# Patient Record
Sex: Female | Born: 1960 | Hispanic: No | Marital: Single | Smoking: Never smoker
Health system: Southern US, Community
[De-identification: ages and names within clinical notes are randomized; demographics above are authoritative.]

---

## 1998-05-14 ENCOUNTER — Other Ambulatory Visit: Admission: RE | Admit: 1998-05-14 | Discharge: 1998-05-14 | Payer: Self-pay | Admitting: Gynecology

## 2015-05-12 ENCOUNTER — Ambulatory Visit (INDEPENDENT_AMBULATORY_CARE_PROVIDER_SITE_OTHER): Payer: 59

## 2015-05-12 ENCOUNTER — Ambulatory Visit (INDEPENDENT_AMBULATORY_CARE_PROVIDER_SITE_OTHER): Payer: 59 | Admitting: Physician Assistant

## 2015-05-12 VITALS — BP 132/84 | HR 89 | Temp 98.4°F | Resp 16 | Ht 64.0 in | Wt 132.4 lb

## 2015-05-12 DIAGNOSIS — M10071 Idiopathic gout, right ankle and foot: Secondary | ICD-10-CM

## 2015-05-12 DIAGNOSIS — M25474 Effusion, right foot: Secondary | ICD-10-CM

## 2015-05-12 LAB — POCT CBC
GRANULOCYTE PERCENT: 77.1 % (ref 37–80)
HCT, POC: 38.4 % (ref 37.7–47.9)
Hemoglobin: 13.9 g/dL (ref 12.2–16.2)
Lymph, poc: 1.2 (ref 0.6–3.4)
MCH: 29.8 pg (ref 27–31.2)
MCHC: 36 g/dL — AB (ref 31.8–35.4)
MCV: 82.7 fL (ref 80–97)
MID (CBC): 0.5 (ref 0–0.9)
MPV: 7.4 fL (ref 0–99.8)
PLATELET COUNT, POC: 203 10*3/uL (ref 142–424)
POC GRANULOCYTE: 5.6 (ref 2–6.9)
POC LYMPH PERCENT: 16.3 %L (ref 10–50)
POC MID %: 6.6 %M (ref 0–12)
RBC: 4.65 M/uL (ref 4.04–5.48)
RDW, POC: 13.3 %
WBC: 7.3 10*3/uL (ref 4.6–10.2)

## 2015-05-12 LAB — POCT SEDIMENTATION RATE: POCT SED RATE: 36 mm/hr — AB (ref 0–22)

## 2015-05-12 LAB — URIC ACID: URIC ACID, SERUM: 3.5 mg/dL (ref 2.4–7.0)

## 2015-05-12 LAB — GLUCOSE, POCT (MANUAL RESULT ENTRY): POC Glucose: 99 mg/dl (ref 70–99)

## 2015-05-12 MED ORDER — PREDNISONE 20 MG PO TABS: ORAL_TABLET | ORAL | Status: DC

## 2015-05-12 NOTE — Patient Instructions (Addendum)
Take prednisone 3 tabs for 3 days, 2 tabs for 3 days, then 1 tab for 3 days. Look over information on gout below and diet to follow I will call you with your lab results Return in 1 week if symptoms do not improve or at any time if symptoms worsen.     IF you received an x-ray today, you will receive an invoice from [facility]. Please contact [facility] at [phone] with questions or concerns regarding your invoice.   IF you received labwork today, you will receive an invoice from [employer]. Please contact [facility] at [phone] with questions or concerns regarding your invoice.   Our billing staff will not be able to assist you with questions regarding bills from these companies.  You will be contacted with the lab results as soon as they are available. The fastest way to get your results is to activate your My Chart account. Instructions are located on the last page of this paperwork. If you have not heard from [place] regarding the results in 2 weeks, please contact this office.     goutGout Gout is an inflammatory arthritis caused by a buildup of uric acid crystals in the joints. Uric acid is a chemical that is normally present in the blood. When the level of uric acid in the blood is too high it can form crystals that deposit in your joints and tissues. This causes joint redness, soreness, and swelling (inflammation). Repeat attacks are common. Over time, uric acid crystals can form into masses (tophi) near a joint, destroying bone and causing disfigurement. Gout is treatable and often preventable. CAUSES  The disease begins with elevated levels of uric acid in the blood. Uric acid is produced by your body when it breaks down a naturally found substance called purines. Certain foods you eat, such as meats and fish, contain high amounts of purines. Causes of an elevated uric acid level include:  Being passed down from parent to child  (heredity).  Diseases that cause increased uric acid production (such as obesity, psoriasis, and certain cancers).  Excessive alcohol use.  Diet, especially diets rich in meat and seafood.  Medicines, including certain cancer-fighting medicines (chemotherapy), water pills (diuretics), and aspirin.  Chronic kidney disease. The kidneys are no longer able to remove uric acid well.  Problems with metabolism. Conditions strongly associated with gout include:  Obesity.  High blood pressure.  High cholesterol.  Diabetes. Not everyone with elevated uric acid levels gets gout. It is not understood why some people get gout and others do not. Surgery, joint injury, and eating too much of certain foods are some of the factors that can lead to gout attacks. SYMPTOMS   An attack of gout comes on quickly. It causes intense pain with redness, swelling, and warmth in a joint.  Fever can occur.  Often, only one joint is involved. Certain joints are more commonly involved:  Base of the big toe.  Knee.  Ankle.  Wrist.  Finger. Without treatment, an attack usually goes away in a few days to weeks. Between attacks, you usually will not have symptoms, which is different from many other forms of arthritis. DIAGNOSIS  Your caregiver will suspect gout based on your symptoms and exam. In some cases, tests may be recommended. The tests may include:  Blood tests.  Urine tests.  X-rays.  Joint fluid exam. This exam requires a needle to remove fluid from the joint (arthrocentesis). Using a microscope, gout is confirmed when uric  acid crystals are seen in the joint fluid. TREATMENT  There are two phases to gout treatment: treating the sudden onset (acute) attack and preventing attacks (prophylaxis).  Treatment of an Acute Attack.  Medicines are used. These include anti-inflammatory medicines or steroid medicines.  An injection of steroid medicine into the affected joint is sometimes  necessary.  The painful joint is rested. Movement can worsen the arthritis.  You may use warm or cold treatments on painful joints, depending which works best for you.  Treatment to Prevent Attacks.  If you suffer from frequent gout attacks, your caregiver may advise preventive medicine. These medicines are started after the acute attack subsides. These medicines either help your kidneys eliminate uric acid from your body or decrease your uric acid production. You may need to stay on these medicines for a very long time.  The early phase of treatment with preventive medicine can be associated with an increase in acute gout attacks. For this reason, during the first few months of treatment, your caregiver may also advise you to take medicines usually used for acute gout treatment. Be sure you understand your caregiver's directions. Your caregiver may make several adjustments to your medicine dose before these medicines are effective.  Discuss dietary treatment with your caregiver or dietitian. Alcohol and drinks high in sugar and fructose and foods such as meat, poultry, and seafood can increase uric acid levels. Your caregiver or dietitian can advise you on drinks and foods that should be limited. HOME CARE INSTRUCTIONS   Do not take aspirin to relieve pain. This raises uric acid levels.  Only take over-the-counter or prescription medicines for pain, discomfort, or fever as directed by your caregiver.  Rest the joint as much as possible. When in bed, keep sheets and blankets off painful areas.  Keep the affected joint raised (elevated).  Apply warm or cold treatments to painful joints. Use of warm or cold treatments depends on which works best for you.  Use crutches if the painful joint is in your leg.  Drink enough fluids to keep your urine clear or pale yellow. This helps your body get rid of uric acid. Limit alcohol, sugary drinks, and fructose drinks.  Follow your dietary  instructions. Pay careful attention to the amount of protein you eat. Your daily diet should emphasize fruits, vegetables, whole grains, and fat-free or low-fat milk products. Discuss the use of coffee, vitamin C, and cherries with your caregiver or dietitian. These may be helpful in lowering uric acid levels.  Maintain a healthy body weight. SEEK MEDICAL CARE IF:   You develop diarrhea, vomiting, or any side effects from medicines.  You do not feel better in 24 hours, or you are getting worse. SEEK IMMEDIATE MEDICAL CARE IF:   Your joint becomes suddenly more tender, and you have chills or a fever. MAKE SURE YOU:   Understand these instructions.  Will watch your condition.  Will get help right away if you are not doing well or get worse.   This information is not intended to replace advice given to you by your health care provider. Make sure you discuss any questions you have with your health care provider.   Document Released: 12/25/1999 Document Revised: 01/17/2014 Document Reviewed: 08/10/2011 Elsevier Interactive Patient Education [employer]2016 Elsevier Inc.

## 2015-05-12 NOTE — Progress Notes (Signed)
[facility] [address] [place] [address] [phone]  Date:  05/12/2015   Name:  Kristin Robbins   DOB:  Apr 29, 1960   MRN:  [id number]  PCP:  No primary care provider on file.    Chief Complaint: Foot Swelling   History of Present Illness:  This is a 55 y.o. female who is presenting with right foot pain and swelling x 2 days. States she woke yesterday morning with the pain. As day went on pain worsened. It got to point last night where she could not even touch her foot without causing incredible pain. Pain is a little better this morning. She is feeling ok, no fever or malaise. NKI. She was moving furniture this weekend but states no injury. She has tried some extra strength tylenol without any relief. No hx of gout but her brother has gout. She denies numbness.  Pt does not drink alcohol. Does not eat much red meat. Does not eat much shellfish. States she has oatmeal for breakfast. Salad for lunch. Chicken and vegetables for dinner.  Review of Systems:  Review of Systems See HPI  There are no active problems to display for this patient.   Prior to Admission medications   Not on File    Allergies  Allergen Reactions  . Latex Hives    History reviewed. No pertinent past surgical history.  Social History  Substance Use Topics  . Smoking status: Never Smoker   . Smokeless tobacco: None  . Alcohol Use: No    Family History  Problem Relation Age of Onset  . Hypertension Mother   . Diabetes Father   . Heart disease Father   . Hypertension Father     Medication list has been reviewed and updated.  Physical Examination:  Physical Exam  Constitutional: She is oriented to person, place, and time. She appears well-developed and well-nourished. No distress.  HENT:  Head: Normocephalic and atraumatic.  Right Ear: Hearing normal.  Left Ear: Hearing normal.  Nose: Nose normal.  Eyes: Conjunctivae and lids are normal. Right eye exhibits no  discharge. Left eye exhibits no discharge. No scleral icterus.  Cardiovascular: Normal rate, regular rhythm and normal pulses.   Pulmonary/Chest: Effort normal. No respiratory distress.  Musculoskeletal: Normal range of motion.       Right ankle: Normal. She exhibits normal range of motion. No tenderness. Achilles tendon normal.       Left ankle: Normal.       Right foot: There is tenderness, bony tenderness and swelling (swelling, erythema and warmth over dorsal and later foot). There is normal range of motion and normal capillary refill.       Left foot: Normal.  Pedal pulses intact and symmetric  Neurological: She is alert and oriented to person, place, and time. She has normal strength. No sensory deficit. Gait (antalgic) abnormal.  Skin: Skin is warm, dry and intact.  Psychiatric: She has a normal mood and affect. Her speech is normal and behavior is normal. Thought content normal.   BP 132/84 mmHg  Pulse 89  Temp(Src) 98.4 F (36.9 C) (Oral)  Resp 16  Ht  (1.626 m)  Wt 132 lb 6.4 oz (60.056 kg)  BMI 22.72 kg/m2  SpO2 98% Dg Foot Complete Right  05/12/2015  CLINICAL DATA:  Right foot pain and swelling. EXAM: RIGHT FOOT COMPLETE - 3+ VIEW COMPARISON:  No recent prior. FINDINGS: No acute bony or joint abnormality identified. No evidence fracture dislocation. IMPRESSION:  No acute abnormality. Electronically Signed   By: Maisie Fushomas  Register   On: 05/12/2015 09:14   Results for orders placed or performed in visit on 05/12/15  POCT CBC  Result Value Ref Range   WBC 7.3 4.6 - 10.2 K/uL   Lymph, poc 1.2 0.6 - 3.4   POC LYMPH PERCENT 16.3 10 - 50 %L   MID (cbc) 0.5 0 - 0.9   POC MID % 6.6 0 - 12 %M   POC Granulocyte 5.6 2 - 6.9   Granulocyte percent 77.1 37 - 80 %G   RBC 4.65 4.04 - 5.48 M/uL   Hemoglobin 13.9 12.2 - 16.2 g/dL   HCT, POC 29.538.4 62.137.7 - 47.9 %   MCV 82.7 80 - 97 fL   MCH, POC 29.8 27 - 31.2 pg   MCHC 36.0 (A) 31.8 - 35.4 g/dL   RDW, POC 30.813.3 %   Platelet Count, POC  203 142 - 424 K/uL   MPV 7.4 0 - 99.8 fL  POCT SEDIMENTATION RATE  Result Value Ref Range   POCT SED RATE 36 (A) 0 - 22 mm/hr  POCT glucose (manual entry)  Result Value Ref Range   POC Glucose 99 70 - 99 mg/dl    Assessment and Plan:  1. Acute idiopathic gout of right foot 2. Swelling of foot joint, right Radiograph negative. CBC wnl. Sed rate mildly elevated to 36. Suspected gout. Treat with prednisone taper. Uric acid pending. Could not identify a cause for gout, possibly just genetic. She eats very healthy diet, low in red meat and seafood. No alcohol intake. No medications regularly. Return if symptoms not improving in 1 week or at any time if symptoms worsen. - predniSONE (DELTASONE) 20 MG tablet; Take 3 PO QAM x3days, 2 PO QAM x3days, 1 PO QAM x3days  Dispense: 18 tablet; Refill: 0 - DG Foot Complete Right; Future - POCT CBC - POCT SEDIMENTATION RATE - Uric Acid - POCT glucose (manual entry)   Roswell MinersNicole V. Dyke BrackettBush, PA-C, MHS [facility]  05/12/2015

## 2015-06-30 ENCOUNTER — Ambulatory Visit (INDEPENDENT_AMBULATORY_CARE_PROVIDER_SITE_OTHER): Payer: 59 | Admitting: Family Medicine

## 2015-06-30 VITALS — BP 126/88 | HR 81 | Temp 98.6°F | Resp 16 | Ht 65.0 in | Wt 132.4 lb

## 2015-06-30 DIAGNOSIS — M2550 Pain in unspecified joint: Secondary | ICD-10-CM | POA: Diagnosis not present

## 2015-06-30 DIAGNOSIS — M1990 Unspecified osteoarthritis, unspecified site: Secondary | ICD-10-CM | POA: Diagnosis not present

## 2015-06-30 DIAGNOSIS — M0579 Rheumatoid arthritis with rheumatoid factor of multiple sites without organ or systems involvement: Secondary | ICD-10-CM

## 2015-06-30 LAB — COMPREHENSIVE METABOLIC PANEL
ALK PHOS: 74 U/L (ref 33–130)
ALT: 27 U/L (ref 6–29)
AST: 21 U/L (ref 10–35)
Albumin: 4.3 g/dL (ref 3.6–5.1)
BUN: 11 mg/dL (ref 7–25)
CO2: 26 mmol/L (ref 20–31)
Calcium: 8.8 mg/dL (ref 8.6–10.4)
Chloride: 102 mmol/L (ref 98–110)
Creat: 0.7 mg/dL (ref 0.50–1.05)
GLUCOSE: 110 mg/dL — AB (ref 65–99)
POTASSIUM: 3.8 mmol/L (ref 3.5–5.3)
Sodium: 139 mmol/L (ref 135–146)
Total Bilirubin: 0.6 mg/dL (ref 0.2–1.2)
Total Protein: 6.8 g/dL (ref 6.1–8.1)

## 2015-06-30 LAB — C-REACTIVE PROTEIN: CRP: 1.8 mg/dL — AB (ref <0.60)

## 2015-06-30 LAB — POCT CBC
GRANULOCYTE PERCENT: 77.9 % (ref 37–80)
HCT, POC: 42.2 % (ref 37.7–47.9)
Hemoglobin: 14.9 g/dL (ref 12.2–16.2)
Lymph, poc: 0.9 (ref 0.6–3.4)
MCH, POC: 29.1 pg (ref 27–31.2)
MCHC: 35.2 g/dL (ref 31.8–35.4)
MCV: 82.6 fL (ref 80–97)
MID (CBC): 0.4 (ref 0–0.9)
MPV: 7.5 fL (ref 0–99.8)
PLATELET COUNT, POC: 200 10*3/uL (ref 142–424)
POC Granulocyte: 4.8 (ref 2–6.9)
POC LYMPH %: 14.9 % (ref 10–50)
POC MID %: 7.2 %M (ref 0–12)
RBC: 5.11 M/uL (ref 4.04–5.48)
RDW, POC: 13.6 %
WBC: 6.1 10*3/uL (ref 4.6–10.2)

## 2015-06-30 LAB — VITAMIN B12: Vitamin B-12: 384 pg/mL (ref 200–1100)

## 2015-06-30 LAB — TSH: TSH: 2.37 mIU/L

## 2015-06-30 LAB — RHEUMATOID FACTOR: RHEUMATOID FACTOR: 476 [IU]/mL — AB (ref <=14)

## 2015-06-30 LAB — POCT SEDIMENTATION RATE: POCT SED RATE: 35 mm/h — AB (ref 0–22)

## 2015-06-30 LAB — CK: Total CK: 37 U/L (ref 7–177)

## 2015-06-30 MED ORDER — INDOMETHACIN 50 MG PO CAPS: 50.0000 mg | ORAL_CAPSULE | Freq: Two times a day (BID) | ORAL | Status: AC

## 2015-06-30 NOTE — Patient Instructions (Signed)
Do not use the indomethacin with any other otc pain medication other than tylenol/acetaminophen - so no aleve, ibuprofen, motrin, advil, etc.    IF you received an x-ray today, you will receive an invoice from [facility]. Please contact [facility] at [phone] with questions or concerns regarding your invoice.   IF you received labwork today, you will receive an invoice from [employer]. Please contact [facility] at [phone] with questions or concerns regarding your invoice.   Our billing staff will not be able to assist you with questions regarding bills from these companies.  You will be contacted with the lab results as soon as they are available. The fastest way to get your results is to activate your My Chart account. Instructions are located on the last page of this paperwork. If you have not heard from [place] regarding the results in 2 weeks, please contact this office.      Arthritis Arthritis is a term that is commonly used to refer to joint pain or joint disease. There are more than 100 types of arthritis. CAUSES The most common cause of this condition is wear and tear of a joint. Other causes include:  Gout.  Inflammation of a joint.  An infection of a joint.  Sprains and other injuries near the joint.  A drug reaction or allergic reaction. In some cases, the cause may not be known. SYMPTOMS The main symptom of this condition is pain in the joint with movement. Other symptoms include:  Redness, swelling, or stiffness at a joint.  Warmth coming from the joint.  Fever.  Overall feeling of illness. DIAGNOSIS This condition may be diagnosed with a physical exam and tests, including:  Blood tests.  Urine tests.  Imaging tests, such as MRI, X-rays, or a CT scan. Sometimes, fluid is removed from a joint for testing. TREATMENT Treatment for this condition may involve:  Treatment of the cause, if it is  known.  Rest.  Raising (elevating) the joint.  Applying cold or hot packs to the joint.  Medicines to improve symptoms and reduce inflammation.  Injections of a steroid such as cortisone into the joint to help reduce pain and inflammation. Depending on the cause of your arthritis, you may need to make lifestyle changes to reduce stress on your joint. These changes may include exercising more and losing weight. HOME CARE INSTRUCTIONS Medicines  Take over-the-counter and prescription medicines only as told by your health care provider.  Do not take aspirin to relieve pain if gout is suspected. Activities  Rest your joint if told by your health care provider. Rest is important when your disease is active and your joint feels painful, swollen, or stiff.  Avoid activities that make the pain worse. It is important to balance activity with rest.  Exercise your joint regularly with range-of-motion exercises as told by your health care provider. Try doing low-impact exercise, such as:  Swimming.  Water aerobics.  Biking.  Walking. Joint Care  If your joint is swollen, keep it elevated if told by your health care provider.  If your joint feels stiff in the morning, try taking a warm shower.  If directed, apply heat to the joint. If you have diabetes, do not apply heat without permission from your health care provider.  Put a towel between the joint and the hot pack or heating pad.  Leave the heat on the area for 20-30 minutes.  If directed, apply ice to the joint:  Put ice in  a plastic bag.  Place a towel between your skin and the bag.  Leave the ice on for 20 minutes, 2-3 times per day.  Keep all follow-up visits as told by your health care provider. This is important. SEEK MEDICAL CARE IF:  The pain gets worse.  You have a fever. SEEK IMMEDIATE MEDICAL CARE IF:  You develop severe joint pain, swelling, or redness.  Many joints become painful and swollen.  You  develop severe back pain.  You develop severe weakness in your leg.  You cannot control your bladder or bowels.   This information is not intended to replace advice given to you by your health care provider. Make sure you discuss any questions you have with your health care provider.   Document Released: 02/04/2004 Document Revised: 09/17/2014 Document Reviewed: 03/24/2014 Elsevier Interactive Patient Education [employer]2016 Elsevier Inc.

## 2015-06-30 NOTE — Progress Notes (Signed)
By signing my name below, I, Mesha Guinyard, attest that this documentation has been prepared under the direction and in the presence of Norberto Sorenson, MD.  Electronically Signed: Arvilla Market, Medical Scribe. 06/30/2015. 8:36 AM.  Subjective:    Patient ID: Kristin Robbins, female    DOB: May 27, 1960, 55 y.o.   MRN: [id number]  HPI Chief Complaint  Patient presents with  . Hand Pain    L wrist, R Hand, digits 2 & 3  . Edema    L wrist  . Shoulder Pain    L shoulder, tender to touch   First encounter 1 week ago HPI Comments: Kristin Robbins is a 55 y.o. female who presents to the [facility] complaining of swelling of the right 2nd DIP, 3rd PIP, and 1st IP finger onset 4 days ago. Pt states the dorsal aspect of her left wrist on the radial aspect had the same thing going on, and 2 days later it was as if it never happened. Pt reports associated symptoms of erythema and warmth in her fingers and wrist. Pt states she couldn't move her fingers and wrist due to swelling.  Pt reports left shouler pain onset yesterday- will last about 2 days as well. Pt reports weakness in her shoulder. Pt couldn't lift or move her shoulders with out pain producing tears. 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 Pt states a warm shower helped relieve her pain. Pt took Tylenol that gave little to no relief. Pt thought it might be triggered by food allergies so she stoped eating peanut butter, orange juice, and cut out saltty foods. Pt occasionally takes Vit. B complex supplements. Pt had a little cup of coffee, a boiled egg and some water this morning- her typical breakfast. Pt walks a little during lunch- she does desk work 8 hours a day. Pt has never had joint pain in the past. Joints were red and hot  Pt was in here May 2nd for swollen ankle and was put on Prednisone- she thought she had gout. Pt was on Prdenisone for 9 days.   FHx: Brother had gout. Mom has swollen joints but doesn't suffer from  it. Pt doesn't have a PCP.  Radial aspect of the dorsum on the left wrist  There are no active problems to display for this patient.  No past medical history on file. No past surgical history on file. Allergies  Allergen Reactions  . Latex Hives   Prior to Admission medications   Medication Sig Start Date End Date Taking? Authorizing Provider  predniSONE (DELTASONE) 20 MG tablet Take 3 PO QAM x3days, 2 PO QAM x3days, 1 PO QAM x3days Patient not taking: Reported on 06/30/2015 05/12/15   Dorna Leitz, PA-C   Social History   Social History  . Marital Status: Single    Spouse Name: N/A  . Number of Children: N/A  . Years of Education: N/A   Occupational History  . Not on file.   Social History Main Topics  . Smoking status: Never Smoker   . Smokeless tobacco: Not on file  . Alcohol Use: No  . Drug Use: No  . Sexual Activity: Not on file   Other Topics Concern  . Not on file   Social History Narrative   Depression screen [facility] 2/9 06/30/2015 05/12/2015  Decreased Interest 0 0  Down, Depressed, Hopeless 0 0  PHQ - 2 Score 0 0   Review of Systems  Constitutional: Positive  for fatigue. Negative for fever, chills and diaphoresis.  HENT: Negative for rhinorrhea and sneezing.   Respiratory: Negative for cough, chest tightness and shortness of breath.   Cardiovascular: Negative for chest pain and palpitations.  Gastrointestinal: Negative for abdominal pain, diarrhea and constipation.  Musculoskeletal: Positive for joint swelling and arthralgias. Negative for neck pain.  Skin: Negative for rash.  Neurological: Positive for weakness.       When shiulder hurts her arm has spasms  Psychiatric/Behavioral: Positive for sleep disturbance (bc of pain).     Objective:  BP 126/88 mmHg  Pulse 81  Temp(Src) 98.6 F (37 C) (Oral)  Resp 16  Ht 5\' 5"  (1.651 m)  Wt 132 lb 6.4 oz (60.056 kg)  BMI 22.03 kg/m2  SpO2 99%  Physical Exam  Constitutional: She appears well-developed and  well-nourished. No distress.  HENT:  Head: Normocephalic and atraumatic.  Eyes: Conjunctivae are normal.  Neck: Neck supple. No thyroid mass and no thyromegaly present.  Cardiovascular: Normal rate, regular rhythm and normal heart sounds.  Exam reveals no gallop and no friction rub.   No murmur heard. Pulmonary/Chest: Effort normal and breath sounds normal. No respiratory distress. She has no wheezes. She has no rales. She exhibits no tenderness.  Musculoskeletal:  Shoulder: mild tenderness over her left acromial bursa. No warmth, hyperesthesia, erythema, or edema  Lymphadenopathy:    She has no cervical adenopathy.  Neurological: She is alert.  Skin: Skin is warm and dry.  Psychiatric: She has a normal mood and affect. Her behavior is normal.  Nursing note and vitals reviewed.   Assessment & Plan:   1. Arthralgia   2. Joint inflammation   3. Rheumatoid arthritis involving multiple sites with positive rheumatoid factor (HCC)   Labs HIGHLY positive for RA - no family or personal h/o of any rheumatologic illness but pt had first sxs just 6 wks ago and since that time has now had 4 different joints (right foot, right IP joints, left wrist, now left shoulder) flair in isolated self-resolving episodes. She did NOT tolerate prednisone well so start on high dose NSAID - pt likely very good candidate for DMARD considering she is relatively young and very healthy so refer to rheum for further eval and treatment.   Orders Placed This Encounter  Procedures  . C-reactive protein  . ANA  . Comprehensive metabolic panel  . TSH  . Vitamin B12  . CK  . Rheumatoid factor  . Cyclic Citrul Peptide Antibody, IGG  . Anti-nuclear ab-titer (ANA titer)  . Ambulatory referral to Rheumatology    Referral Priority:  Routine    Referral Type:  Consultation    Referral Reason:  Specialty Services Required    Requested Specialty:  Rheumatology    Number of Visits Requested:  1  . POCT CBC  . POCT  SEDIMENTATION RATE    Meds ordered this encounter  Medications  . indomethacin (INDOCIN) 50 MG capsule    Sig: Take 1 capsule (50 mg total) by mouth 2 (two) times daily with a meal. As needed for joint inflammation and pain    Dispense:  60 capsule    Refill:  1    I personally performed the services described in this documentation, which was scribed in my presence. The recorded information has been reviewed and considered, and addended by me as needed.   Norberto SorensonEva Delford Wingert, M.D.  [facility]  [place] [address] [place], [place] [address] [phone] phone [phone]  [phone] fax  07/03/2015 10:16 AM  Results for orders placed or performed in visit on 06/30/15  C-reactive protein  Result Value Ref Range   CRP 1.8 (H) <0.60 mg/dL  ANA  Result Value Ref Range   Anit Nuclear Antibody(ANA) POS (A) NEGATIVE  Comprehensive metabolic panel  Result Value Ref Range   Sodium 139 135 - 146 mmol/L   Potassium 3.8 3.5 - 5.3 mmol/L   Chloride 102 98 - 110 mmol/L   CO2 26 20 - 31 mmol/L   Glucose, Bld 110 (H) 65 - 99 mg/dL   BUN 11 7 - 25 mg/dL   Creat 9.81 1.91 - 4.78 mg/dL   Total Bilirubin 0.6 0.2 - 1.2 mg/dL   Alkaline Phosphatase 74 33 - 130 U/L   AST 21 10 - 35 U/L   ALT 27 6 - 29 U/L   Total Protein 6.8 6.1 - 8.1 g/dL   Albumin 4.3 3.6 - 5.1 g/dL   Calcium 8.8 8.6 - 29.5 mg/dL  TSH  Result Value Ref Range   TSH 2.37 mIU/L  Vitamin B12  Result Value Ref Range   Vitamin B-12 384 200 - 1100 pg/mL  CK  Result Value Ref Range   Total CK 37 7 - 177 U/L  Rheumatoid factor  Result Value Ref Range   Rhuematoid fact SerPl-aCnc 476 (H) <=14 IU/mL  Cyclic Citrul Peptide Antibody, IGG  Result Value Ref Range   Cyclic Citrullin Peptide Ab >621 (H) Units  Anti-nuclear ab-titer (ANA titer)  Result Value Ref Range   ANA Pattern 1 HOMOGENEOUS (A)    ANA Titer 1 1:80 (H) titer  POCT CBC  Result Value Ref Range   WBC 6.1 4.6 - 10.2 K/uL   Lymph, poc 0.9 0.6 - 3.4    POC LYMPH PERCENT 14.9 10 - 50 %L   MID (cbc) 0.4 0 - 0.9   POC MID % 7.2 0 - 12 %M   POC Granulocyte 4.8 2 - 6.9   Granulocyte percent 77.9 37 - 80 %G   RBC 5.11 4.04 - 5.48 M/uL   Hemoglobin 14.9 12.2 - 16.2 g/dL   HCT, POC 30.8 65.7 - 47.9 %   MCV 82.6 80 - 97 fL   MCH, POC 29.1 27 - 31.2 pg   MCHC 35.2 31.8 - 35.4 g/dL   RDW, POC 84.6 %   Platelet Count, POC 200 142 - 424 K/uL   MPV 7.5 0 - 99.8 fL  POCT SEDIMENTATION RATE  Result Value Ref Range   POCT SED RATE 35 (A) 0 - 22 mm/hr

## 2015-07-01 LAB — CYCLIC CITRUL PEPTIDE ANTIBODY, IGG

## 2015-07-02 LAB — ANTI-NUCLEAR AB-TITER (ANA TITER)

## 2015-07-02 LAB — ANA: ANA: POSITIVE — AB

## 2015-07-03 ENCOUNTER — Encounter: Payer: Self-pay | Admitting: Family Medicine

## 2015-08-19 DIAGNOSIS — R768 Other specified abnormal immunological findings in serum: Secondary | ICD-10-CM | POA: Insufficient documentation

## 2015-09-04 DIAGNOSIS — R768 Other specified abnormal immunological findings in serum: Secondary | ICD-10-CM

## 2016-02-22 DIAGNOSIS — R768 Other specified abnormal immunological findings in serum: Secondary | ICD-10-CM

## 2016-03-16 LAB — BASIC METABOLIC PANEL: Creatinine: 12 mg/dL — AB (ref <=1.1)

## 2016-03-16 LAB — HEPATIC FUNCTION PANEL
ALK PHOS: 60 U/L (ref 25–125)
ALT: 52 U/L — AB (ref 7–35)
AST: 28 U/L (ref 13–35)

## 2016-03-16 LAB — CBC AND DIFFERENTIAL
PLATELETS: 257 10*3/uL (ref 150–399)
WBC: 6.1 10^3/mL

## 2016-11-11 IMAGING — CR DG FOOT COMPLETE 3+V*R*
3 series · 3 of 3 positions shown · non-contrast
Comparison: No recent prior.

CLINICAL DATA: Right foot pain and swelling.

EXAM:
RIGHT FOOT COMPLETE - 3+ VIEW

[AP]
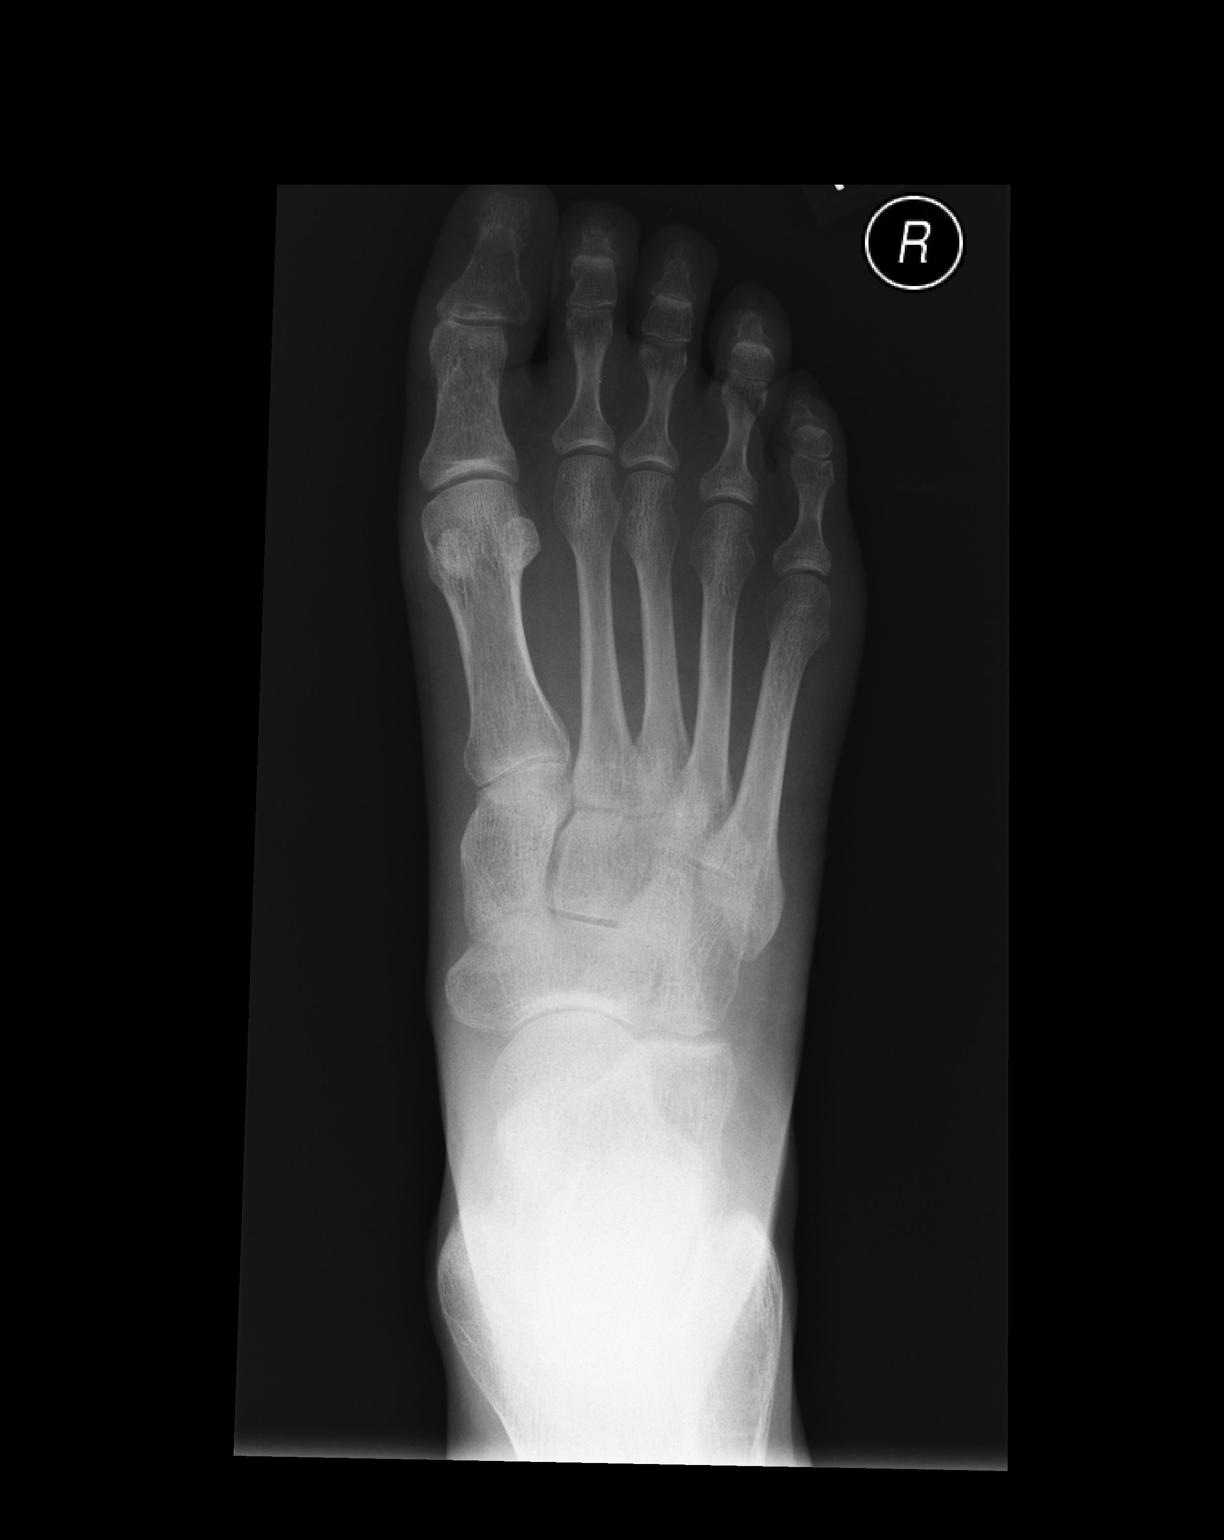

[ap obl int rot]
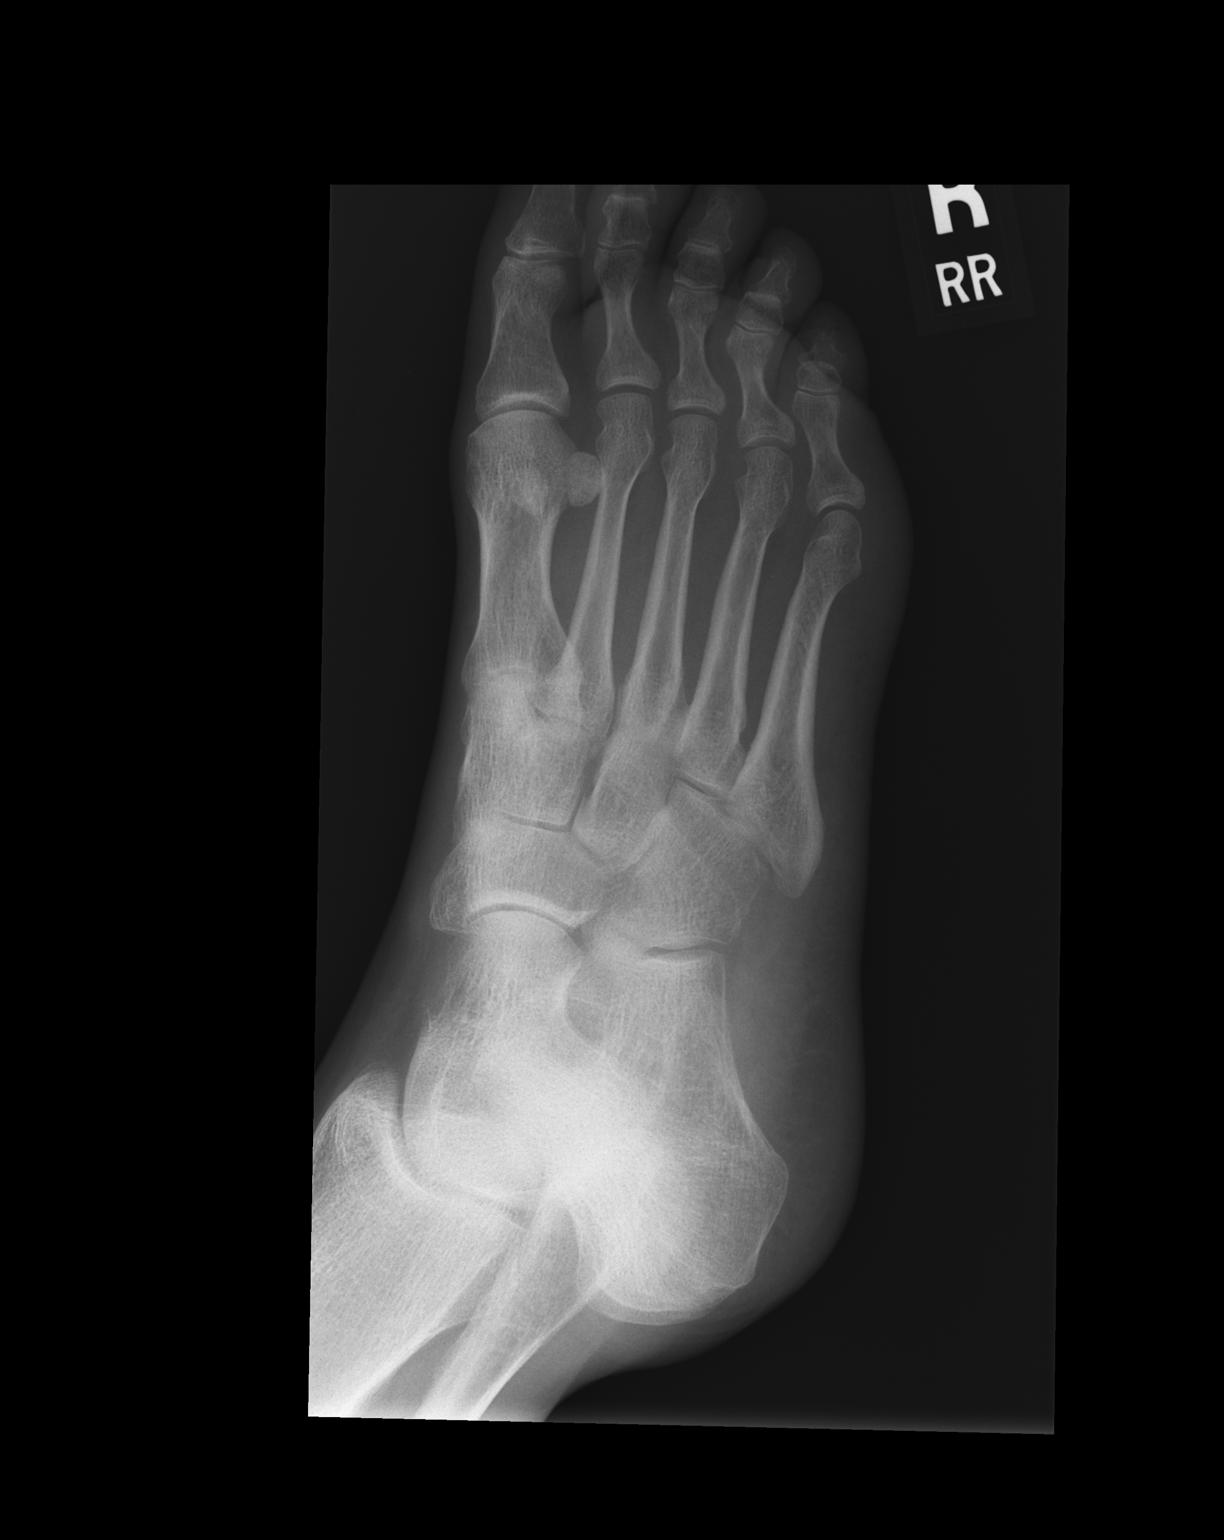

[lateral]
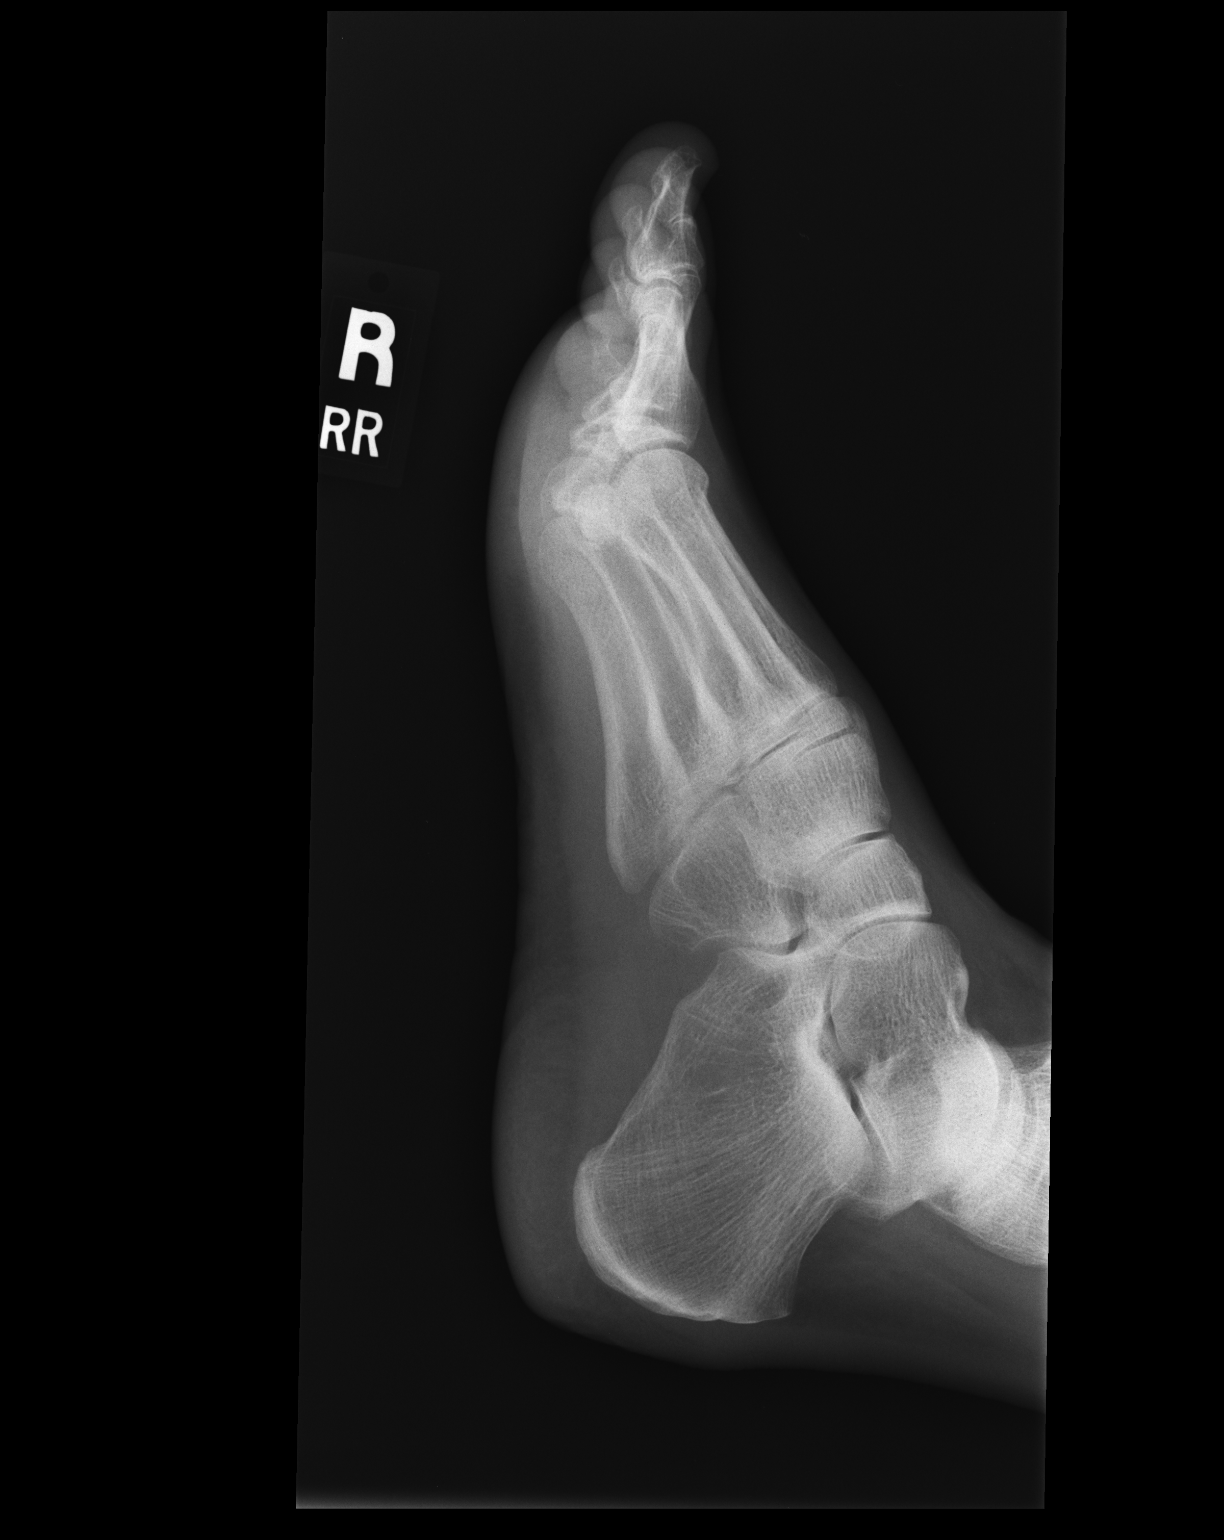

[3 of 3 positions shown; findings below may reference images not displayed]

FINDINGS: No acute bony or joint abnormality identified. No evidence fracture
dislocation.
IMPRESSION: No acute abnormality.
